# Patient Record
Sex: Female | Born: 2000 | Race: White | Hispanic: No | Marital: Single | Smoking: Never smoker
Health system: Southern US, Community
[De-identification: ages and names within clinical notes are randomized; demographics above are authoritative.]

---
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
## 2019-09-17 IMAGING — CT CT HEAD W/O CM
3 series · 14 of 47 positions shown, 16 images · non-contrast
Comparison: None.

CLINICAL DATA: Seizure.

EXAM:
CT HEAD WITHOUT CONTRAST
TECHNIQUE: Contiguous axial images were obtained from the base of the skull
through the vertex without intravenous contrast.

[Series 3: head 5.0 h30s · axial · 0.45mm/px · z∈[-125,+5]mm · 8 of 32 slices shown, 10 images]
[im 3/32  brain]
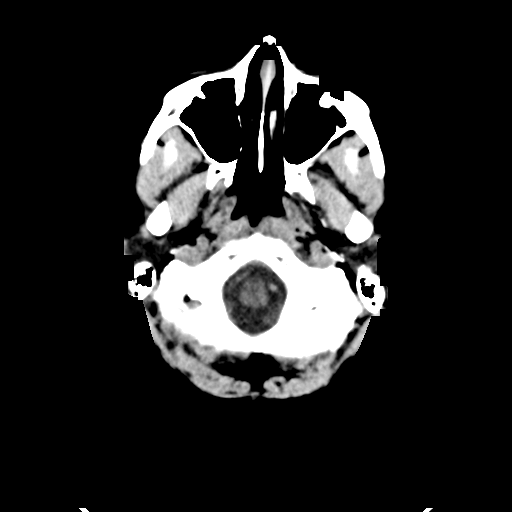
[im 3/32  bone]
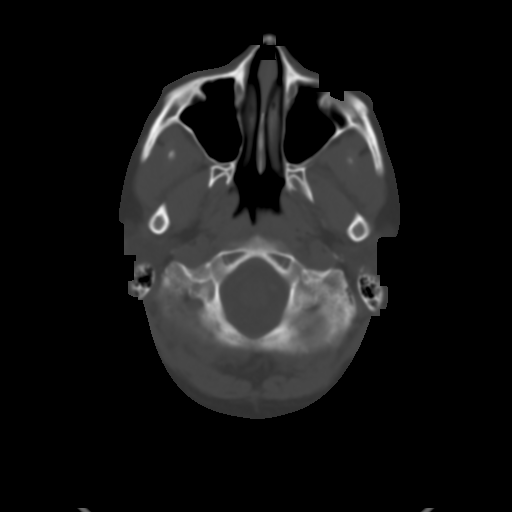
[im 7/32  brain]
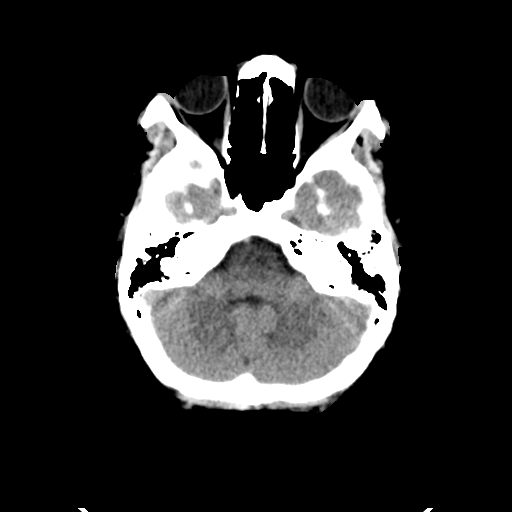
[im 10/32  brain]
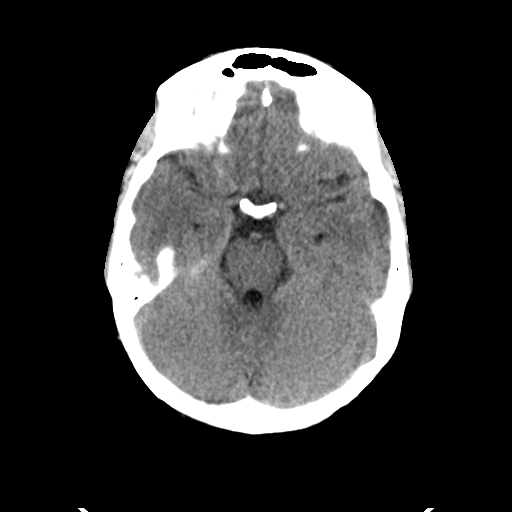
[im 14/32  brain]
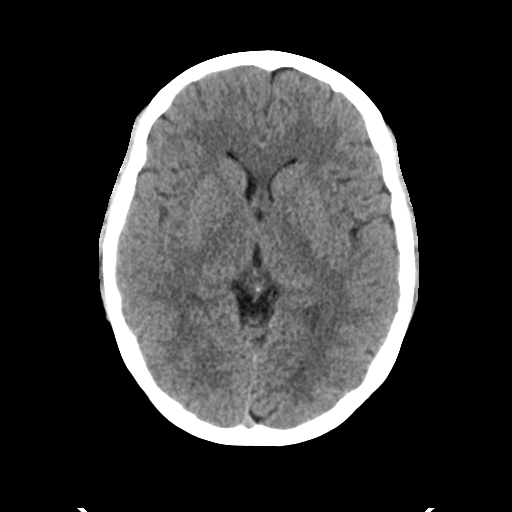
[im 18/32  brain]
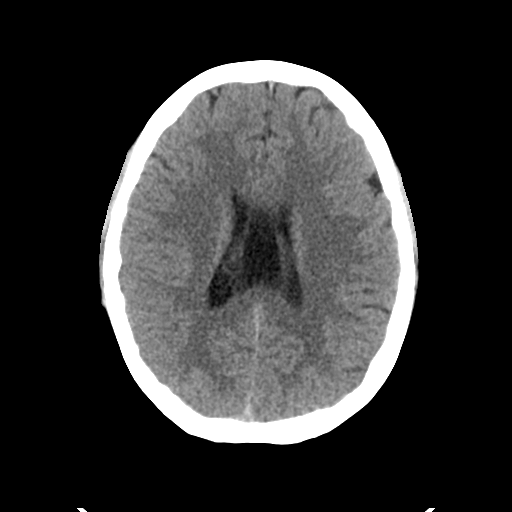
[im 18/32  bone]
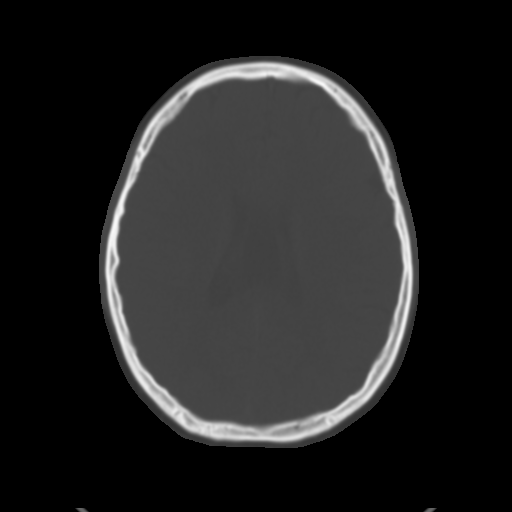
[im 22/32  brain]
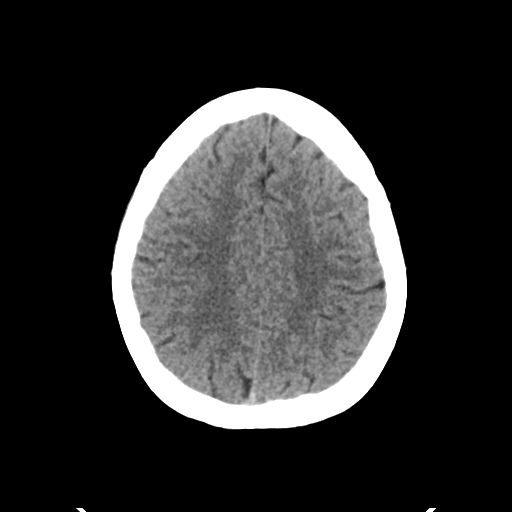
[im 25/32  brain]
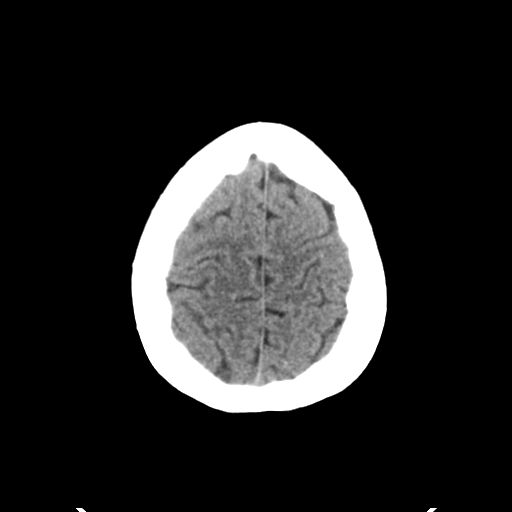
[im 29/32  brain]
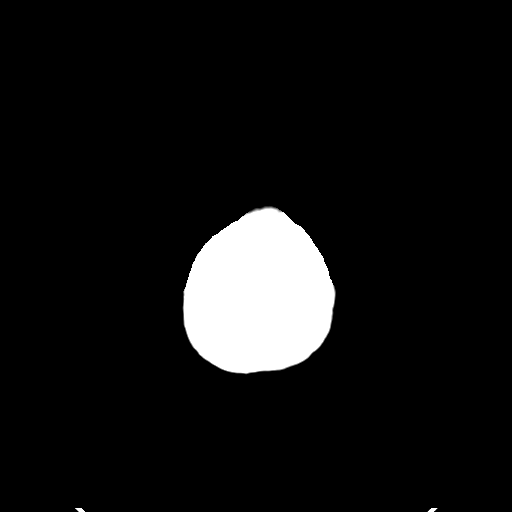

[Series 5: head 3.0 mpr cor · coronal · 0.31mm/px · 3 of 67 slices shown]
[im 23/67  brain]
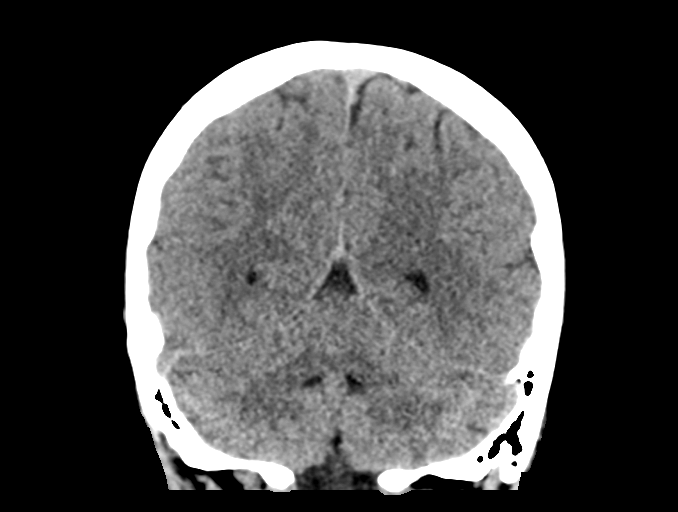
[im 30/67  brain]
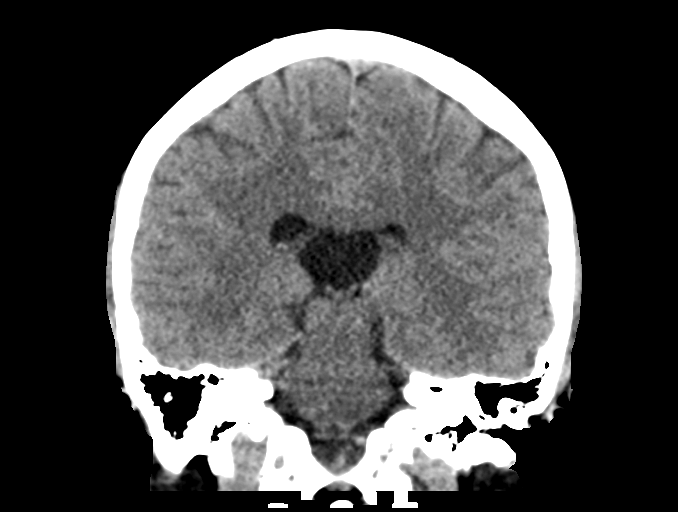
[im 37/67  brain]
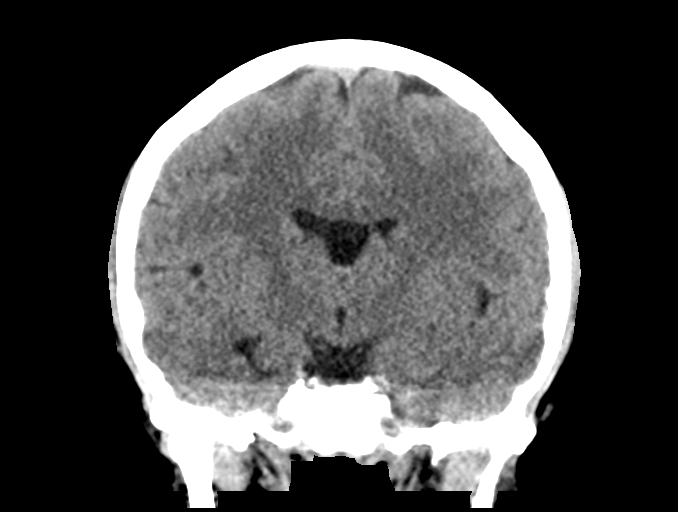

[Series 6: head 3.0 mpr sag · sagittal · 0.31mm/px · 3 of 67 slices shown]
[im 23/67  brain]
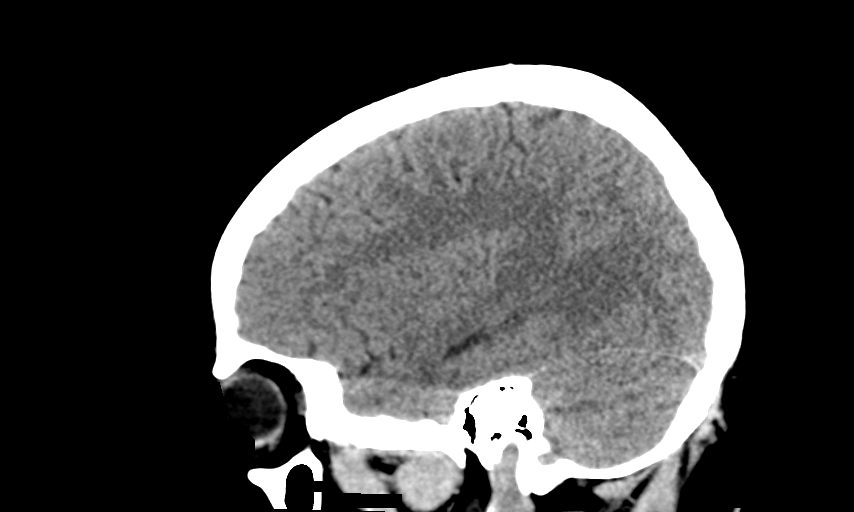
[im 34/67  brain]
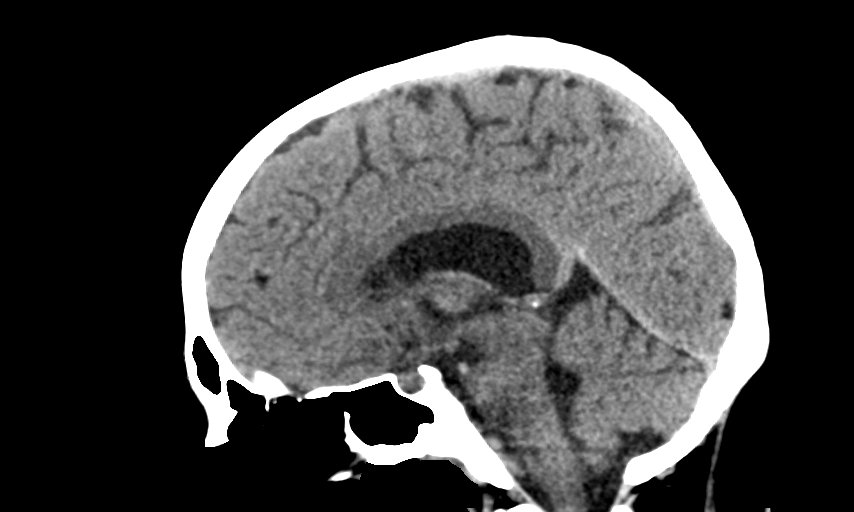
[im 45/67  brain]
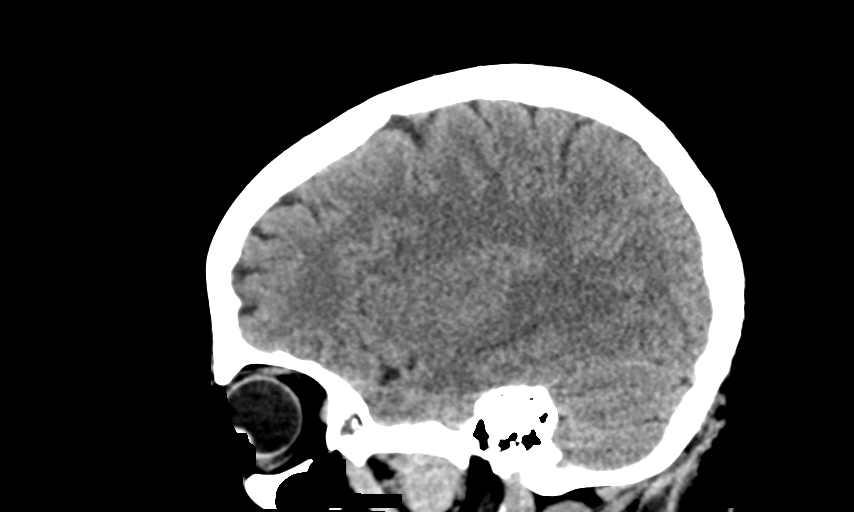

[14 of 47 positions shown; findings below may reference images not displayed]

FINDINGS: Brain: No evidence of infarction, hemorrhage, hydrocephalus,
extra-axial collection or mass lesion/mass effect. Cavum velum
interpositum.

Vascular: Negative

Skull: Negative

Sinuses/Orbits: Negative
IMPRESSION: Negative.  No acute finding or explanation for seizure.

## 2019-12-21 IMAGING — US US OB COMP LESS 14 WK
1 series · 14 of 28 positions shown · non-contrast
Comparison: None.

CLINICAL DATA: Pregnant, cramping

EXAM:
OBSTETRIC <14 WK US AND TRANSVAGINAL OB US
TECHNIQUE: Both transabdominal and transvaginal ultrasound examinations were
performed for complete evaluation of the gestation as well as the
maternal uterus, adnexal regions, and pelvic cul-de-sac.
Transvaginal technique was performed to assess early pregnancy.

[Series 1: us ob comp less 14 wk · 0.17mm/px · 284 acquisitions, 14 frames shown]
[im 11/284]
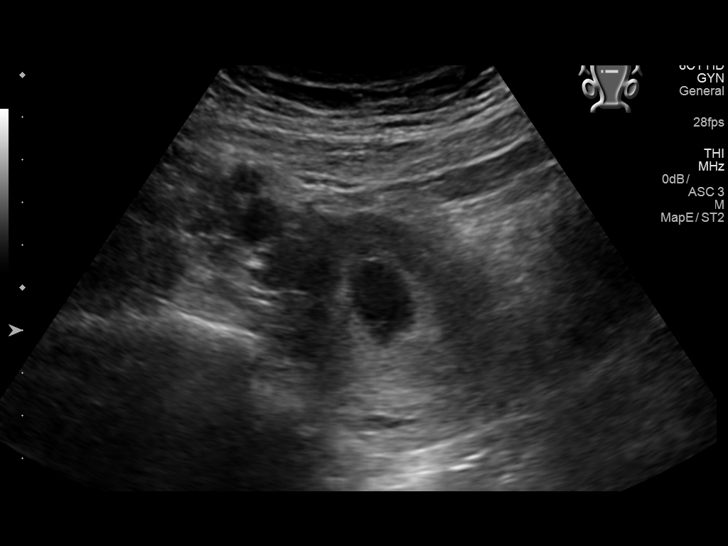
[im 32/284]
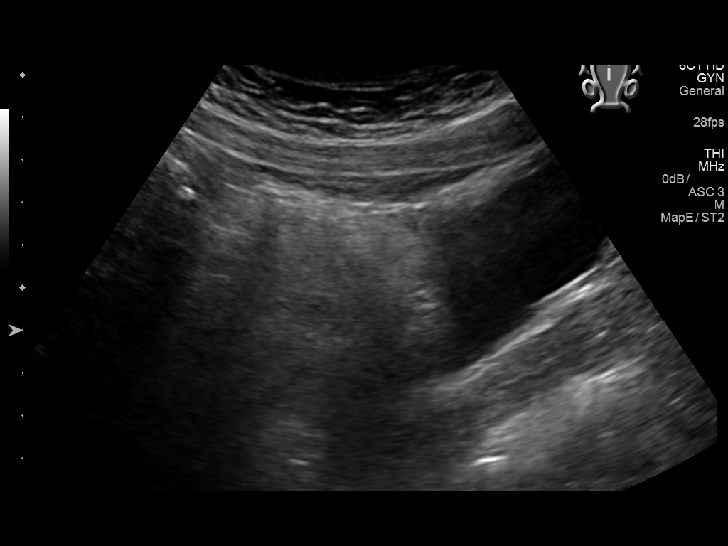
[im 53/284]
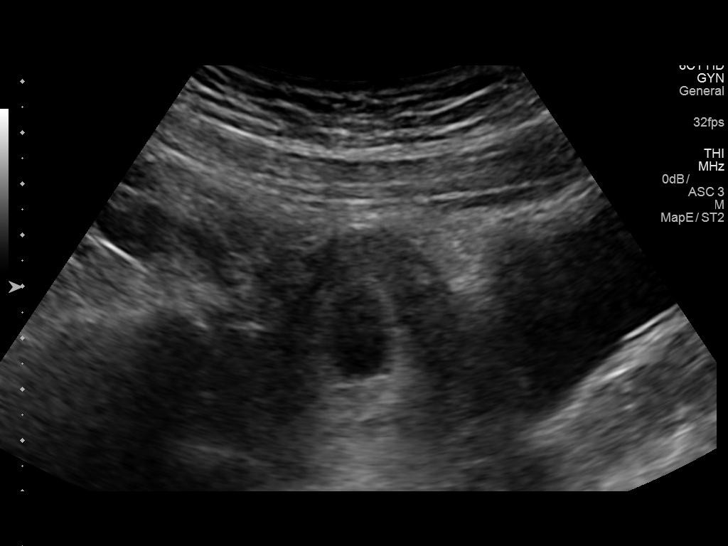
[im 74/284]
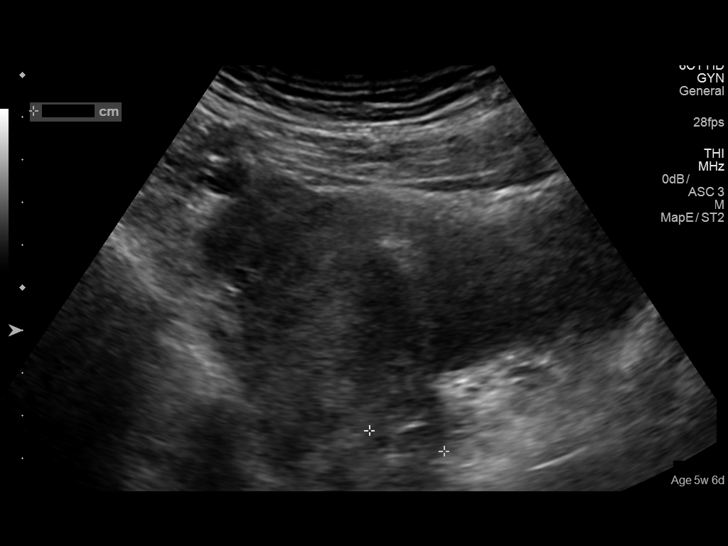
[im 95/284]
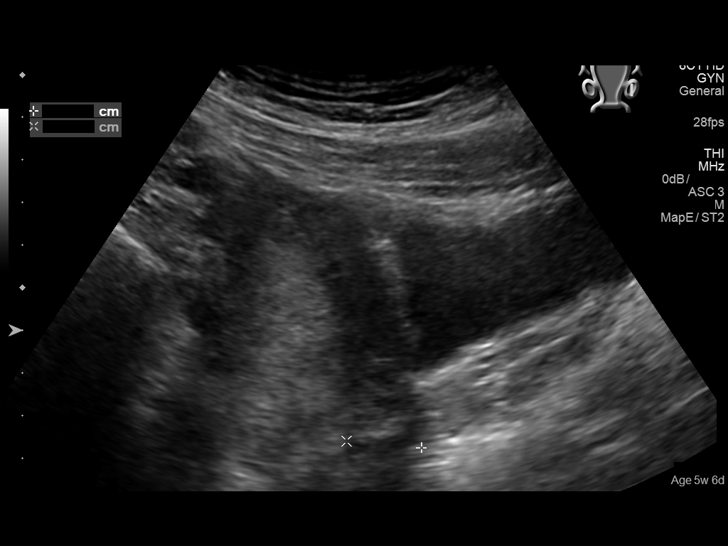
[im 116/284]
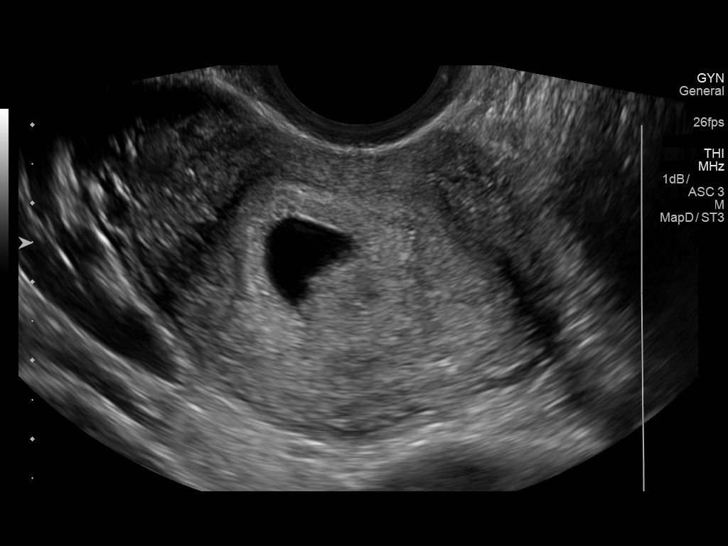
[im 137/284]
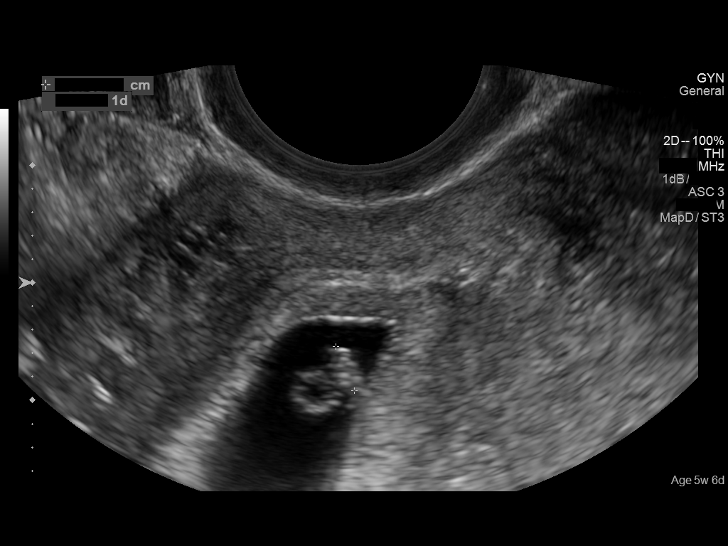
[im 158/284]
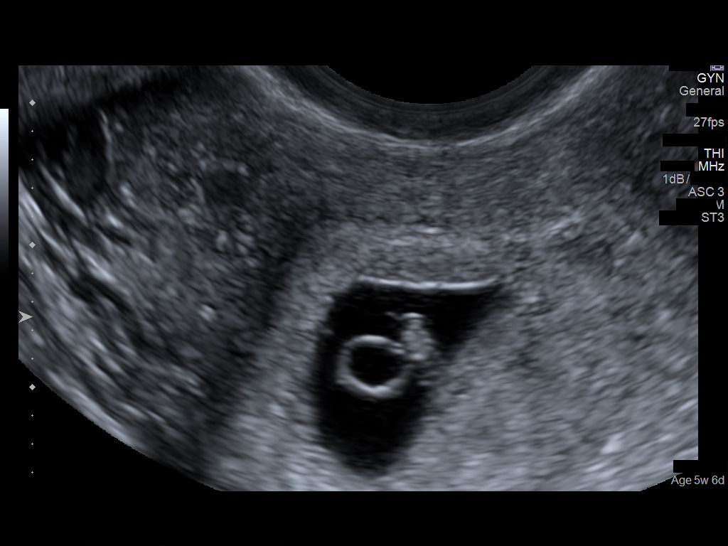
[im 179/284]
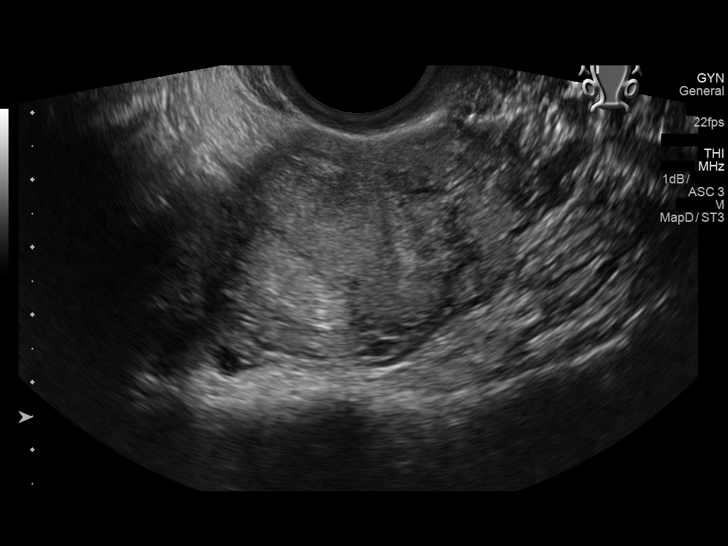
[im 200/284]
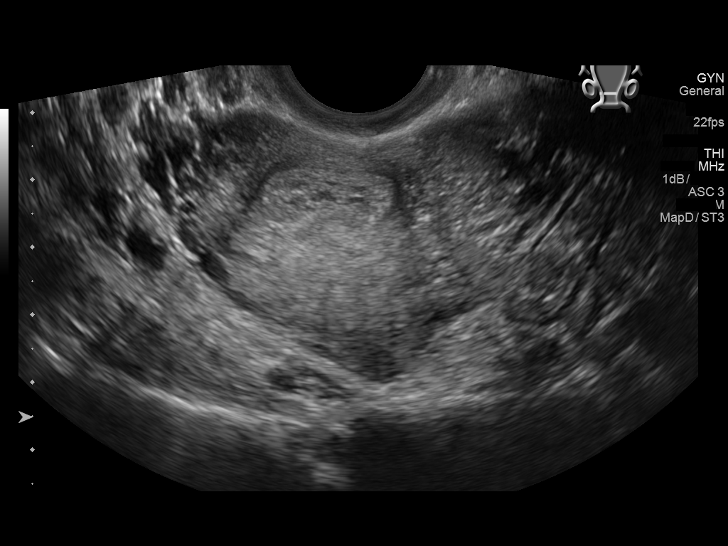
[im 221/284]
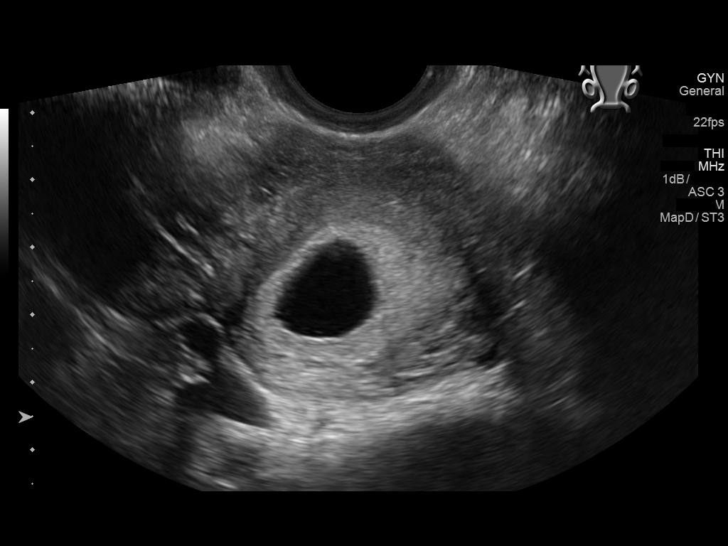
[im 242/284]
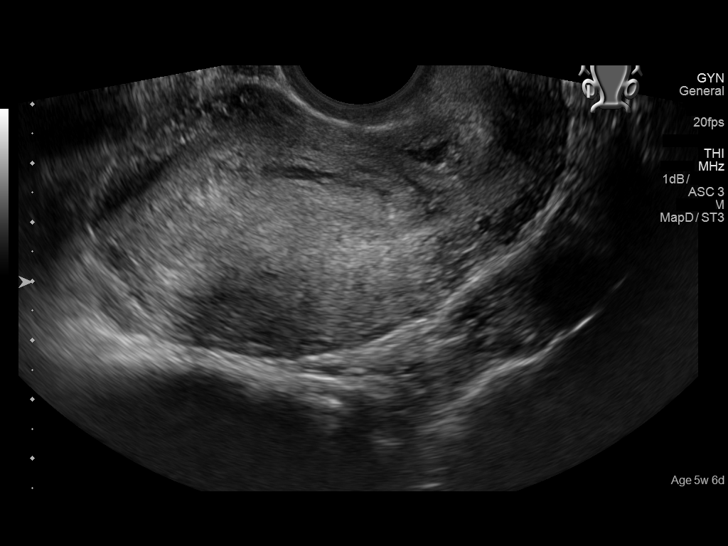
[im 263/284]
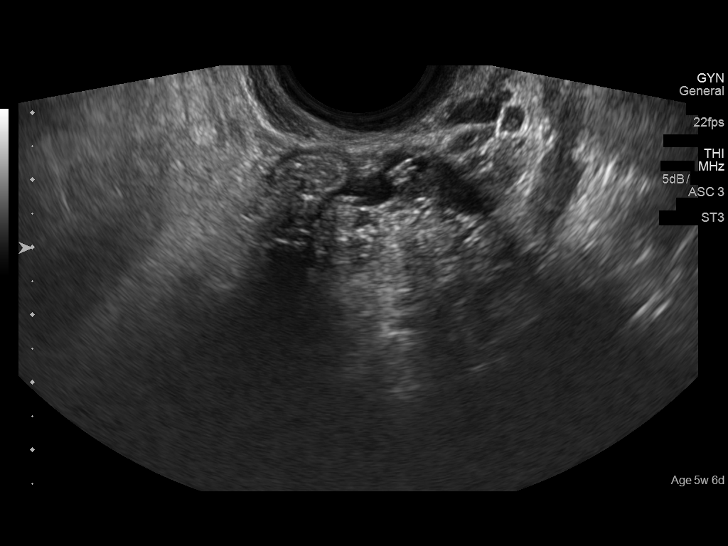
[im 284/284]
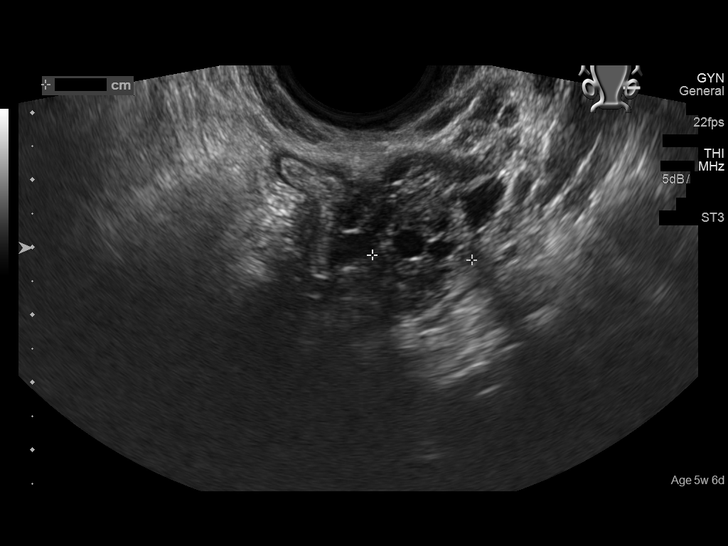

[14 of 28 positions shown; findings below may reference images not displayed]

FINDINGS: Intrauterine gestational sac: Single

Yolk sac:  Visualized.

Embryo:  Visualized.

Cardiac Activity: Visualized.

Heart Rate: 124 bpm

CRL:  4.1 mm   6 w   1 d                  US EDC: 12/12/2018

Subchorionic hemorrhage:  None visualized.

Maternal uterus/adnexae: Bilateral ovaries are within normal limits.

No free fluid.
IMPRESSION: Single live intrauterine gestation, with estimated gestational age 6
weeks 1 day by crown-rump length, as above.
